# Patient Record
Sex: Male | Born: 1940 | Race: White | Hispanic: No | Marital: Married
Health system: Southern US, Community
[De-identification: ages and names within clinical notes are randomized; demographics above are authoritative.]

---

## 1998-05-11 ENCOUNTER — Other Ambulatory Visit: Admission: RE | Admit: 1998-05-11 | Discharge: 1998-05-11 | Payer: Self-pay | Admitting: Urology

## 1999-11-06 ENCOUNTER — Encounter: Payer: Self-pay | Admitting: Emergency Medicine

## 1999-11-06 ENCOUNTER — Inpatient Hospital Stay (HOSPITAL_COMMUNITY): Admission: EM | Admit: 1999-11-06 | Discharge: 1999-11-07 | Payer: Self-pay | Admitting: Emergency Medicine

## 2000-03-31 ENCOUNTER — Encounter (INDEPENDENT_AMBULATORY_CARE_PROVIDER_SITE_OTHER): Payer: Self-pay

## 2000-03-31 ENCOUNTER — Ambulatory Visit (HOSPITAL_COMMUNITY): Admission: RE | Admit: 2000-03-31 | Discharge: 2000-03-31 | Payer: Self-pay | Admitting: Gastroenterology

## 2001-08-12 ENCOUNTER — Emergency Department (HOSPITAL_COMMUNITY): Admission: EM | Admit: 2001-08-12 | Discharge: 2001-08-12 | Payer: Self-pay | Admitting: Emergency Medicine

## 2001-08-12 ENCOUNTER — Encounter: Payer: Self-pay | Admitting: Emergency Medicine

## 2002-11-08 ENCOUNTER — Ambulatory Visit (HOSPITAL_BASED_OUTPATIENT_CLINIC_OR_DEPARTMENT_OTHER): Admission: RE | Admit: 2002-11-08 | Discharge: 2002-11-08 | Payer: Self-pay | Admitting: Orthopedic Surgery

## 2003-11-01 ENCOUNTER — Ambulatory Visit (HOSPITAL_BASED_OUTPATIENT_CLINIC_OR_DEPARTMENT_OTHER): Admission: RE | Admit: 2003-11-01 | Discharge: 2003-11-01 | Payer: Self-pay | Admitting: General Surgery

## 2005-04-22 ENCOUNTER — Ambulatory Visit (HOSPITAL_COMMUNITY): Admission: RE | Admit: 2005-04-22 | Discharge: 2005-04-22 | Payer: Self-pay | Admitting: Gastroenterology

## 2005-04-22 ENCOUNTER — Encounter (INDEPENDENT_AMBULATORY_CARE_PROVIDER_SITE_OTHER): Payer: Self-pay | Admitting: *Deleted

## 2008-01-21 ENCOUNTER — Ambulatory Visit: Payer: Self-pay | Admitting: Vascular Surgery

## 2011-04-01 ENCOUNTER — Other Ambulatory Visit: Payer: Self-pay | Admitting: Gastroenterology

## 2011-05-13 NOTE — Procedures (Signed)
CAROTID DUPLEX EXAM   INDICATION:  Right eye hemianopsia.  The patient had one episode of the  upper vision in his right eye going away for a short time.  He has had  no further episodes.   HISTORY:  Diabetes:  No.  Cardiac:  No.  Hypertension:  No.  Smoking:  Yes, 1 pack per day for 50 years.  Previous Surgery:  No.  CV History:  No.  Amaurosis Fugax No, Paresthesias No, Hemiparesis No                                       RIGHT             LEFT  Brachial systolic pressure:         160               150  Brachial Doppler waveforms:         Biphasic          Biphasic  Vertebral direction of flow:        Antegrade         Antegrade  DUPLEX VELOCITIES (cm/sec)  CCA peak systolic                   92                87  ECA peak systolic                   129               123  ICA peak systolic                   80                78  ICA end diastolic                   22                17  PLAQUE MORPHOLOGY:                  Mixed             Calcified  PLAQUE AMOUNT:                      Mild              Mild  PLAQUE LOCATION:                    ICA, ECA          ICA, ECA   IMPRESSION:  1. Mild bilateral ECA stenoses.  2. 20-39% bilateral ICA stenoses.  3. A preliminary copy was faxed to Dr. Dierdre Harness office.   ___________________________________________  Di Kindle. Edilia Bo, M.D.   DP/MEDQ  D:  01/21/2008  T:  01/21/2008  Job:  [id number]   cc:   Geoffry Paradise, M.D.

## 2011-05-16 NOTE — Op Note (Signed)
NAMEMILDRED, Logan Mckinney                 ACCOUNT NO.:  [id number]   MEDICAL RECORD NO.:  [id number]          PATIENT TYPE:  AMB   LOCATION:  ENDO                         FACILITY:  [facility]   PHYSICIAN:  John C. Madilyn Fireman, M.D.    DATE OF BIRTH:  1941/03/07   DATE OF PROCEDURE:  04/22/2005  DATE OF DISCHARGE:                                 OPERATIVE REPORT   PROCEDURE:  Colonoscopy.   INDICATIONS FOR PROCEDURE:  History of adenomatous colon polyps in 2001.   DESCRIPTION OF PROCEDURE:  The patient was placed in the left lateral  decubitus position then placed on the pulse monitor with continuous low-flow  oxygen delivered by nasal cannula. He was sedated with 62.5 mcg IV fentanyl  and 7 mg IV Versed. The Olympus video colonoscope was inserted into the  rectum and advanced cecum, confirmed by transillumination at McBurney's  point visualization of ileocecal valve and appendiceal orifice. The prep was  excellent. The cecum, ascending, transverse, descending and sigmoid colon  all appeared normal with no masses, polyps, diverticula or other mucosal  abnormalities. In the rectum, there was seen a 6 mm proximal polyp that was  fulgurated by hot biopsy. In the more distal rectum at approximately 10 cm,  there was a more the diffuse sessile 8 mm polyp that was removed by snare.  The remainder of the rectum appeared normal down to the anus where  retroflexed view revealed no obvious internal hemorrhoids. The scope was  then withdrawn and the patient returned to the recovery room in stable  condition. He tolerated the procedure well and there were no immediate  complications.   IMPRESSION:  Rectal polyps.   PLAN:  Await histology and will repeat colonoscopy in 3-5 years.      JCH/MEDQ  D:  04/22/2005  T:  04/22/2005  Job:  [id number]   cc:   Geoffry Paradise, M.D.  [address]  [place]  [place] [address]  Fax: [phone]

## 2011-05-16 NOTE — Op Note (Signed)
NAME:  Logan Mckinney, Logan Mckinney                           ACCOUNT NO.:  [id number]   MEDICAL RECORD NO.:  [id number]                   PATIENT TYPE:  AMB   LOCATION:  DSC                                  FACILITY:  [facility]   PHYSICIAN:  Sharlet Salina T. Hoxworth, M.D.          DATE OF BIRTH:  [birth year]   DATE OF PROCEDURE:  11/01/2003  DATE OF DISCHARGE:                                 OPERATIVE REPORT   PREOPERATIVE DIAGNOSIS:  Right inguinal hernia.   POSTOPERATIVE DIAGNOSIS:  Right inguinal hernia.   PROCEDURE:  Repair of right inguinal hernia.   SURGEON:  Lorne Skeens. Hoxworth, M.D.   ANESTHESIA:  Local with IV sedation.   BRIEF HISTORY:  The patient is a 70 year old white male with the recent  onset of an uncomfortable bulge in his right groin.  Examination confirms a  reducible right inguinal hernia.  Repair using mesh with an open technique  under local anesthesia with sedation has been recommended and accepted and  the patient is brought to the operating room for this procedure.  The risks  of bleeding, infection and recurrence were discussed and understood  preoperatively.   DESCRIPTION OF PROCEDURE:  The patient was brought to the operating room and  placed in the supine position on the operating table and IV sedation was  administered.  He received preoperative antibiotics.  The right groin was  sterilely prepped and draped.  Local anesthesia was used to block the  ilioinguinal nerve and infiltrate the area of the incision along the right  groin.  An oblique incision was made and dissection was carried down through  subcutaneous tissue.  The external oblique was identified, anesthetized and  divided along the lines of its fibers to the external ring.  The cord was  dissected off the floor of the pubic tubercle and cremasteric fibers divided  bilaterally completely freeing the cord up to the inguinal ring.  There was  a good sized direct defect.  The preperitoneal tissue was  dissected away  from the cord up to the internal ring.  There was a large cord lipoma  associated with the cord structures which was dissected free, clamped and  divided at the internal ring and tied with 3-0 Vicryl.  There was no  peritoneal sac.  The attenuated transversalis fascia was imbricated with  running 2-0 Prolene closing the floor down to a normal contour.  A piece of  Parietex mesh was then used as an onlay widely covering the direct space  with tails that go around the cord at the internal ring.  The mesh was  sutured to the pubic tubercle and then to the iliopubic tract and inguinal  ligament with running 2-0 Prolene.  Immediately the mesh was sutured to the  rectus sheath with interrupted 2-0 Prolene.  The tails were then tacked  together out of the cord with interrupted 2-0 Prolene creating a new  internal  ring as noted with a fingertip.  The cord was returned to its  anatomic position.  The external oblique was closed with this with running 3-  0 Vicryl.  Scarpa's fascia was closed with running 3-0 Vicryl.  The skin was  closed with running subcuticular 4-0 Monocryl and Steri-Strips.  Sponge,  needle and instrument counts were correct.  A dry sterile dressing was  applied and the patient was taken to the recovery room in stable condition.                                               Lorne Skeens. Hoxworth, M.D.    Tory Emerald  D:  11/01/2003  T:  11/01/2003  Job:  [id number]

## 2011-05-16 NOTE — Op Note (Signed)
NAME:  Logan Mckinney, Logan Mckinney                           ACCOUNT NO.:  [id number]   MEDICAL RECORD NO.:  [id number]                   PATIENT TYPE:  AMB   LOCATION:  DSC                                  FACILITY:  [facility]   PHYSICIAN:  Robert A. Thurston Hole, M.D.              DATE OF BIRTH:  1941-11-03   DATE OF PROCEDURE:  11/08/2002  DATE OF DISCHARGE:                                 OPERATIVE REPORT   PREOPERATIVE DIAGNOSIS:  Left elbow synovitis with lateral epicondylitis.   POSTOPERATIVE DIAGNOSIS:  Left elbow synovitis with lateral epicondylitis.   PROCEDURE:  1. Left elbow examination under anesthesia followed by arthroscopic partial     synovectomy.  2. Left elbow arthroscopically assisted lateral epicondylar release.   SURGEON:  Elana Alm. Thurston Hole, M.D.   ANESTHESIA:  General anesthesia.   OPERATIVE TIME:  45 minutes.   COMPLICATIONS:  None.   INDICATIONS FOR PROCEDURE:  The patient is a 70 year old gentleman who has  had significant left elbow pain for the past six to eight months increasing  in nature with signs and symptoms of synovitis and lateral epicondylitis who  has failed conservative care and is now to undergo arthroscopy with lateral  epicondylar release.   DESCRIPTION OF PROCEDURE:  The patient is brought to the operating room on  November 08, 2002.  He was placed under general anesthesia on the stretcher  and then carefully turned prone on the operating table with all pressure  points and genitalia well padded.  His left elbow was examined under  anesthesia.  He had range of motion from 0  to 125 degrees.  The elbow with  stable ligamentous examination.  The left arm was then prepped using sterile  DuraPrep and draped using sterile technique.  He received Ancef 1 g IV  preoperatively for prophylaxis.  Initially through an anterior medial portal  staying just anterior to the medial epicondyle to prevent any injury to the  ulnar nerve, the initial portal was made.  The  arthroscope was placed  through this portal into the joint directly.  Through an anterior lateral  portal, staying just anterior to the lateral epicondyle, a second portal was  made protecting the radial nerve and an arthroscopic probe was placed.  On  initial inspection of the joint, the articular cartilage on the radial head  and capitellum were found to be intact.  The articular cartilage on the  cornoid and on the distal humerus also found to be intact.  Moderate  synovitis in the anterior compartment was debrided.  No loose bodies were  noted.  The lateral epicondylar region was exposed and a second  anterolateral portal was made and debridement and partial release of the  ECRB and ECRL tendon was carried out with the arthroscopic debrider under  direct arthroscopic visualization.  After this was done, no further  pathology was noted anteriorly.  A direct posterior  portal was made and a  posterior lateral portal was made.  The olecranon fossa was entered and  exposed.  Moderate synovitis was debrided.  No loose bodies were noted.  The  olecranon itself was found to be intact and normal in appearance.  The  posterior lateral and posterior medial gutters were carefully inspected.  This was found to be free of pathology.  After this was done, it was felt  that all pathology had been satisfactorily addressed.  The instruments were  removed.  The portals were closed with 4-0 nylon suture and injected with  0.25% Marcaine with epinephrine. The lateral epicondylar region also  injected with Depo-Medrol 40 mg and Marcaine as well.  After this was done,  sterile dressings were applied.  The patient then turned supine, awakened,  extubated and taken to the recovery room in a stable condition.  Needle and  sponge counts correct x2 at the end of the case.   FOLLOW UP CARE:  The patient will be followed as an outpatient on Vicodin,  Naprosyn and Keflex.  I will see him back in the office in a week  for  sutures out and follow-up.                                                Robert A. Thurston Hole, M.D.    RAW/MEDQ  D:  11/08/2002  T:  11/08/2002  Job:  [id number]

## 2012-03-12 DIAGNOSIS — Z125 Encounter for screening for malignant neoplasm of prostate: Secondary | ICD-10-CM | POA: Diagnosis not present

## 2012-03-12 DIAGNOSIS — E785 Hyperlipidemia, unspecified: Secondary | ICD-10-CM | POA: Diagnosis not present

## 2012-03-12 DIAGNOSIS — R7301 Impaired fasting glucose: Secondary | ICD-10-CM | POA: Diagnosis not present

## 2012-03-18 DIAGNOSIS — Z0000 Encounter for general adult medical examination without abnormal findings: Secondary | ICD-10-CM | POA: Diagnosis not present

## 2012-03-18 DIAGNOSIS — R7301 Impaired fasting glucose: Secondary | ICD-10-CM | POA: Diagnosis not present

## 2012-03-18 DIAGNOSIS — Z125 Encounter for screening for malignant neoplasm of prostate: Secondary | ICD-10-CM | POA: Diagnosis not present

## 2012-03-18 DIAGNOSIS — F17200 Nicotine dependence, unspecified, uncomplicated: Secondary | ICD-10-CM | POA: Diagnosis not present

## 2012-03-18 DIAGNOSIS — E785 Hyperlipidemia, unspecified: Secondary | ICD-10-CM | POA: Diagnosis not present

## 2012-03-19 DIAGNOSIS — Z1212 Encounter for screening for malignant neoplasm of rectum: Secondary | ICD-10-CM | POA: Diagnosis not present

## 2012-04-28 ENCOUNTER — Other Ambulatory Visit: Payer: Self-pay | Admitting: Dermatology

## 2012-04-28 DIAGNOSIS — C44721 Squamous cell carcinoma of skin of unspecified lower limb, including hip: Secondary | ICD-10-CM | POA: Diagnosis not present

## 2012-04-28 DIAGNOSIS — L723 Sebaceous cyst: Secondary | ICD-10-CM | POA: Diagnosis not present

## 2012-09-29 DIAGNOSIS — E785 Hyperlipidemia, unspecified: Secondary | ICD-10-CM | POA: Diagnosis not present

## 2012-09-29 DIAGNOSIS — Z23 Encounter for immunization: Secondary | ICD-10-CM | POA: Diagnosis not present

## 2012-09-29 DIAGNOSIS — M1990 Unspecified osteoarthritis, unspecified site: Secondary | ICD-10-CM | POA: Diagnosis not present

## 2012-09-29 DIAGNOSIS — R7301 Impaired fasting glucose: Secondary | ICD-10-CM | POA: Diagnosis not present

## 2012-09-29 DIAGNOSIS — F17200 Nicotine dependence, unspecified, uncomplicated: Secondary | ICD-10-CM | POA: Diagnosis not present

## 2012-10-04 DIAGNOSIS — L723 Sebaceous cyst: Secondary | ICD-10-CM | POA: Diagnosis not present

## 2012-10-04 DIAGNOSIS — L570 Actinic keratosis: Secondary | ICD-10-CM | POA: Diagnosis not present

## 2012-12-24 DIAGNOSIS — J309 Allergic rhinitis, unspecified: Secondary | ICD-10-CM | POA: Diagnosis not present

## 2012-12-24 DIAGNOSIS — J019 Acute sinusitis, unspecified: Secondary | ICD-10-CM | POA: Diagnosis not present

## 2013-03-10 DIAGNOSIS — H5200 Hypermetropia, unspecified eye: Secondary | ICD-10-CM | POA: Diagnosis not present

## 2013-03-10 DIAGNOSIS — H2510 Age-related nuclear cataract, unspecified eye: Secondary | ICD-10-CM | POA: Diagnosis not present

## 2013-03-10 DIAGNOSIS — H524 Presbyopia: Secondary | ICD-10-CM | POA: Diagnosis not present

## 2013-03-30 DIAGNOSIS — Z125 Encounter for screening for malignant neoplasm of prostate: Secondary | ICD-10-CM | POA: Diagnosis not present

## 2013-03-30 DIAGNOSIS — E785 Hyperlipidemia, unspecified: Secondary | ICD-10-CM | POA: Diagnosis not present

## 2013-03-30 DIAGNOSIS — Z79899 Other long term (current) drug therapy: Secondary | ICD-10-CM | POA: Diagnosis not present

## 2013-03-30 DIAGNOSIS — R7301 Impaired fasting glucose: Secondary | ICD-10-CM | POA: Diagnosis not present

## 2013-04-06 DIAGNOSIS — R7301 Impaired fasting glucose: Secondary | ICD-10-CM | POA: Diagnosis not present

## 2013-04-06 DIAGNOSIS — K219 Gastro-esophageal reflux disease without esophagitis: Secondary | ICD-10-CM | POA: Diagnosis not present

## 2013-04-06 DIAGNOSIS — Z0000 Encounter for general adult medical examination without abnormal findings: Secondary | ICD-10-CM | POA: Diagnosis not present

## 2013-04-06 DIAGNOSIS — N401 Enlarged prostate with lower urinary tract symptoms: Secondary | ICD-10-CM | POA: Diagnosis not present

## 2013-04-06 DIAGNOSIS — F17200 Nicotine dependence, unspecified, uncomplicated: Secondary | ICD-10-CM | POA: Diagnosis not present

## 2013-04-06 DIAGNOSIS — M1990 Unspecified osteoarthritis, unspecified site: Secondary | ICD-10-CM | POA: Diagnosis not present

## 2013-04-06 DIAGNOSIS — Z79899 Other long term (current) drug therapy: Secondary | ICD-10-CM | POA: Diagnosis not present

## 2013-04-06 DIAGNOSIS — Z1331 Encounter for screening for depression: Secondary | ICD-10-CM | POA: Diagnosis not present

## 2013-04-06 DIAGNOSIS — E785 Hyperlipidemia, unspecified: Secondary | ICD-10-CM | POA: Diagnosis not present

## 2013-10-20 DIAGNOSIS — Z23 Encounter for immunization: Secondary | ICD-10-CM | POA: Diagnosis not present

## 2013-11-15 ENCOUNTER — Other Ambulatory Visit: Payer: Self-pay | Admitting: Dermatology

## 2013-11-15 DIAGNOSIS — C44319 Basal cell carcinoma of skin of other parts of face: Secondary | ICD-10-CM | POA: Diagnosis not present

## 2013-11-15 DIAGNOSIS — L570 Actinic keratosis: Secondary | ICD-10-CM | POA: Diagnosis not present

## 2013-11-16 DIAGNOSIS — L570 Actinic keratosis: Secondary | ICD-10-CM | POA: Diagnosis not present

## 2013-11-16 DIAGNOSIS — C44319 Basal cell carcinoma of skin of other parts of face: Secondary | ICD-10-CM | POA: Diagnosis not present

## 2013-11-16 DIAGNOSIS — D485 Neoplasm of uncertain behavior of skin: Secondary | ICD-10-CM | POA: Diagnosis not present

## 2014-01-12 DIAGNOSIS — C44319 Basal cell carcinoma of skin of other parts of face: Secondary | ICD-10-CM | POA: Diagnosis not present

## 2014-01-16 DIAGNOSIS — C44319 Basal cell carcinoma of skin of other parts of face: Secondary | ICD-10-CM | POA: Diagnosis not present

## 2014-01-18 DIAGNOSIS — C44319 Basal cell carcinoma of skin of other parts of face: Secondary | ICD-10-CM | POA: Diagnosis not present

## 2014-01-20 DIAGNOSIS — C44319 Basal cell carcinoma of skin of other parts of face: Secondary | ICD-10-CM | POA: Diagnosis not present

## 2014-01-23 DIAGNOSIS — C44319 Basal cell carcinoma of skin of other parts of face: Secondary | ICD-10-CM | POA: Diagnosis not present

## 2014-01-25 DIAGNOSIS — C44319 Basal cell carcinoma of skin of other parts of face: Secondary | ICD-10-CM | POA: Diagnosis not present

## 2014-01-27 DIAGNOSIS — C44319 Basal cell carcinoma of skin of other parts of face: Secondary | ICD-10-CM | POA: Diagnosis not present

## 2014-01-30 DIAGNOSIS — C44319 Basal cell carcinoma of skin of other parts of face: Secondary | ICD-10-CM | POA: Diagnosis not present

## 2014-02-01 DIAGNOSIS — C44319 Basal cell carcinoma of skin of other parts of face: Secondary | ICD-10-CM | POA: Diagnosis not present

## 2014-02-03 DIAGNOSIS — C44319 Basal cell carcinoma of skin of other parts of face: Secondary | ICD-10-CM | POA: Diagnosis not present

## 2014-02-06 DIAGNOSIS — C44319 Basal cell carcinoma of skin of other parts of face: Secondary | ICD-10-CM | POA: Diagnosis not present

## 2014-02-08 DIAGNOSIS — C44319 Basal cell carcinoma of skin of other parts of face: Secondary | ICD-10-CM | POA: Diagnosis not present

## 2014-02-10 DIAGNOSIS — C44319 Basal cell carcinoma of skin of other parts of face: Secondary | ICD-10-CM | POA: Diagnosis not present

## 2014-04-04 DIAGNOSIS — Z125 Encounter for screening for malignant neoplasm of prostate: Secondary | ICD-10-CM | POA: Diagnosis not present

## 2014-04-04 DIAGNOSIS — R7301 Impaired fasting glucose: Secondary | ICD-10-CM | POA: Diagnosis not present

## 2014-04-04 DIAGNOSIS — E785 Hyperlipidemia, unspecified: Secondary | ICD-10-CM | POA: Diagnosis not present

## 2014-04-10 DIAGNOSIS — M1990 Unspecified osteoarthritis, unspecified site: Secondary | ICD-10-CM | POA: Diagnosis not present

## 2014-04-10 DIAGNOSIS — Z6827 Body mass index (BMI) 27.0-27.9, adult: Secondary | ICD-10-CM | POA: Diagnosis not present

## 2014-04-10 DIAGNOSIS — Z125 Encounter for screening for malignant neoplasm of prostate: Secondary | ICD-10-CM | POA: Diagnosis not present

## 2014-04-10 DIAGNOSIS — R7301 Impaired fasting glucose: Secondary | ICD-10-CM | POA: Diagnosis not present

## 2014-04-10 DIAGNOSIS — F17200 Nicotine dependence, unspecified, uncomplicated: Secondary | ICD-10-CM | POA: Diagnosis not present

## 2014-04-10 DIAGNOSIS — K219 Gastro-esophageal reflux disease without esophagitis: Secondary | ICD-10-CM | POA: Diagnosis not present

## 2014-04-10 DIAGNOSIS — Z1331 Encounter for screening for depression: Secondary | ICD-10-CM | POA: Diagnosis not present

## 2014-04-10 DIAGNOSIS — Z0000 Encounter for general adult medical examination without abnormal findings: Secondary | ICD-10-CM | POA: Diagnosis not present

## 2014-04-11 DIAGNOSIS — Z1212 Encounter for screening for malignant neoplasm of rectum: Secondary | ICD-10-CM | POA: Diagnosis not present

## 2014-04-26 ENCOUNTER — Other Ambulatory Visit: Payer: Self-pay | Admitting: Physician Assistant

## 2014-04-26 DIAGNOSIS — L570 Actinic keratosis: Secondary | ICD-10-CM | POA: Diagnosis not present

## 2014-04-26 DIAGNOSIS — L280 Lichen simplex chronicus: Secondary | ICD-10-CM | POA: Diagnosis not present

## 2014-04-26 DIAGNOSIS — D485 Neoplasm of uncertain behavior of skin: Secondary | ICD-10-CM | POA: Diagnosis not present

## 2014-04-26 DIAGNOSIS — L821 Other seborrheic keratosis: Secondary | ICD-10-CM | POA: Diagnosis not present

## 2014-06-01 DIAGNOSIS — H60399 Other infective otitis externa, unspecified ear: Secondary | ICD-10-CM | POA: Diagnosis not present

## 2014-06-03 DIAGNOSIS — H60399 Other infective otitis externa, unspecified ear: Secondary | ICD-10-CM | POA: Diagnosis not present

## 2014-06-15 DIAGNOSIS — H2510 Age-related nuclear cataract, unspecified eye: Secondary | ICD-10-CM | POA: Diagnosis not present

## 2014-10-11 DIAGNOSIS — L570 Actinic keratosis: Secondary | ICD-10-CM | POA: Diagnosis not present

## 2014-10-11 DIAGNOSIS — D239 Other benign neoplasm of skin, unspecified: Secondary | ICD-10-CM | POA: Diagnosis not present

## 2014-10-24 DIAGNOSIS — Z23 Encounter for immunization: Secondary | ICD-10-CM | POA: Diagnosis not present

## 2014-11-28 DIAGNOSIS — Z6827 Body mass index (BMI) 27.0-27.9, adult: Secondary | ICD-10-CM | POA: Diagnosis not present

## 2014-11-28 DIAGNOSIS — S39012A Strain of muscle, fascia and tendon of lower back, initial encounter: Secondary | ICD-10-CM | POA: Diagnosis not present

## 2014-12-06 DIAGNOSIS — M5137 Other intervertebral disc degeneration, lumbosacral region: Secondary | ICD-10-CM | POA: Diagnosis not present

## 2014-12-06 DIAGNOSIS — M47817 Spondylosis without myelopathy or radiculopathy, lumbosacral region: Secondary | ICD-10-CM | POA: Diagnosis not present

## 2014-12-06 DIAGNOSIS — Z6827 Body mass index (BMI) 27.0-27.9, adult: Secondary | ICD-10-CM | POA: Diagnosis not present

## 2014-12-06 DIAGNOSIS — S39012A Strain of muscle, fascia and tendon of lower back, initial encounter: Secondary | ICD-10-CM | POA: Diagnosis not present

## 2014-12-06 DIAGNOSIS — M169 Osteoarthritis of hip, unspecified: Secondary | ICD-10-CM | POA: Diagnosis not present

## 2015-02-08 DIAGNOSIS — K5900 Constipation, unspecified: Secondary | ICD-10-CM | POA: Diagnosis not present

## 2015-02-08 DIAGNOSIS — R7302 Impaired glucose tolerance (oral): Secondary | ICD-10-CM | POA: Diagnosis not present

## 2015-02-08 DIAGNOSIS — Z125 Encounter for screening for malignant neoplasm of prostate: Secondary | ICD-10-CM | POA: Diagnosis not present

## 2015-02-08 DIAGNOSIS — M1990 Unspecified osteoarthritis, unspecified site: Secondary | ICD-10-CM | POA: Diagnosis not present

## 2015-02-08 DIAGNOSIS — Z720 Tobacco use: Secondary | ICD-10-CM | POA: Diagnosis not present

## 2015-02-08 DIAGNOSIS — K219 Gastro-esophageal reflux disease without esophagitis: Secondary | ICD-10-CM | POA: Diagnosis not present

## 2015-02-08 DIAGNOSIS — E785 Hyperlipidemia, unspecified: Secondary | ICD-10-CM | POA: Diagnosis not present

## 2015-02-08 DIAGNOSIS — N401 Enlarged prostate with lower urinary tract symptoms: Secondary | ICD-10-CM | POA: Diagnosis not present

## 2015-02-08 DIAGNOSIS — Z6826 Body mass index (BMI) 26.0-26.9, adult: Secondary | ICD-10-CM | POA: Diagnosis not present

## 2015-02-09 DIAGNOSIS — K5900 Constipation, unspecified: Secondary | ICD-10-CM | POA: Diagnosis not present

## 2015-02-21 DIAGNOSIS — L570 Actinic keratosis: Secondary | ICD-10-CM | POA: Diagnosis not present

## 2015-02-21 DIAGNOSIS — D239 Other benign neoplasm of skin, unspecified: Secondary | ICD-10-CM | POA: Diagnosis not present

## 2015-04-09 DIAGNOSIS — Z79899 Other long term (current) drug therapy: Secondary | ICD-10-CM | POA: Diagnosis not present

## 2015-04-09 DIAGNOSIS — R7302 Impaired glucose tolerance (oral): Secondary | ICD-10-CM | POA: Diagnosis not present

## 2015-04-09 DIAGNOSIS — Z125 Encounter for screening for malignant neoplasm of prostate: Secondary | ICD-10-CM | POA: Diagnosis not present

## 2015-04-09 DIAGNOSIS — E785 Hyperlipidemia, unspecified: Secondary | ICD-10-CM | POA: Diagnosis not present

## 2015-04-16 ENCOUNTER — Other Ambulatory Visit: Payer: Self-pay | Admitting: Internal Medicine

## 2015-04-16 DIAGNOSIS — D692 Other nonthrombocytopenic purpura: Secondary | ICD-10-CM | POA: Diagnosis not present

## 2015-04-16 DIAGNOSIS — Z720 Tobacco use: Secondary | ICD-10-CM | POA: Diagnosis not present

## 2015-04-16 DIAGNOSIS — Z139 Encounter for screening, unspecified: Secondary | ICD-10-CM

## 2015-04-16 DIAGNOSIS — R0989 Other specified symptoms and signs involving the circulatory and respiratory systems: Secondary | ICD-10-CM | POA: Diagnosis not present

## 2015-04-16 DIAGNOSIS — E785 Hyperlipidemia, unspecified: Secondary | ICD-10-CM | POA: Diagnosis not present

## 2015-04-16 DIAGNOSIS — M5136 Other intervertebral disc degeneration, lumbar region: Secondary | ICD-10-CM

## 2015-04-16 DIAGNOSIS — Z0000 Encounter for general adult medical examination without abnormal findings: Secondary | ICD-10-CM | POA: Diagnosis not present

## 2015-04-16 DIAGNOSIS — Z23 Encounter for immunization: Secondary | ICD-10-CM | POA: Diagnosis not present

## 2015-04-16 DIAGNOSIS — J449 Chronic obstructive pulmonary disease, unspecified: Secondary | ICD-10-CM | POA: Diagnosis not present

## 2015-04-16 DIAGNOSIS — Z6826 Body mass index (BMI) 26.0-26.9, adult: Secondary | ICD-10-CM | POA: Diagnosis not present

## 2015-04-16 DIAGNOSIS — M5380 Other specified dorsopathies, site unspecified: Secondary | ICD-10-CM | POA: Diagnosis not present

## 2015-04-16 DIAGNOSIS — E119 Type 2 diabetes mellitus without complications: Secondary | ICD-10-CM | POA: Diagnosis not present

## 2015-04-25 DIAGNOSIS — Z1212 Encounter for screening for malignant neoplasm of rectum: Secondary | ICD-10-CM | POA: Diagnosis not present

## 2015-05-03 ENCOUNTER — Ambulatory Visit
Admission: RE | Admit: 2015-05-03 | Discharge: 2015-05-03 | Disposition: A | Discharge status: Home / Self Care | Payer: Medicare Other | Source: Ambulatory Visit | Attending: Internal Medicine | Admitting: Internal Medicine

## 2015-05-03 DIAGNOSIS — Z122 Encounter for screening for malignant neoplasm of respiratory organs: Secondary | ICD-10-CM | POA: Diagnosis not present

## 2015-05-03 DIAGNOSIS — M47816 Spondylosis without myelopathy or radiculopathy, lumbar region: Secondary | ICD-10-CM | POA: Diagnosis not present

## 2015-05-03 DIAGNOSIS — I2510 Atherosclerotic heart disease of native coronary artery without angina pectoris: Secondary | ICD-10-CM | POA: Diagnosis not present

## 2015-05-03 DIAGNOSIS — J432 Centrilobular emphysema: Secondary | ICD-10-CM | POA: Diagnosis not present

## 2015-05-03 DIAGNOSIS — M5126 Other intervertebral disc displacement, lumbar region: Secondary | ICD-10-CM | POA: Diagnosis not present

## 2015-05-03 DIAGNOSIS — F17210 Nicotine dependence, cigarettes, uncomplicated: Secondary | ICD-10-CM | POA: Diagnosis not present

## 2015-05-03 DIAGNOSIS — M5136 Other intervertebral disc degeneration, lumbar region: Secondary | ICD-10-CM

## 2015-05-03 DIAGNOSIS — Z139 Encounter for screening, unspecified: Secondary | ICD-10-CM

## 2015-05-03 DIAGNOSIS — M9973 Connective tissue and disc stenosis of intervertebral foramina of lumbar region: Secondary | ICD-10-CM | POA: Diagnosis not present

## 2015-05-03 DIAGNOSIS — Z85820 Personal history of malignant melanoma of skin: Secondary | ICD-10-CM | POA: Diagnosis not present

## 2015-05-07 ENCOUNTER — Other Ambulatory Visit: Payer: Self-pay | Admitting: Internal Medicine

## 2015-05-07 DIAGNOSIS — M5136 Other intervertebral disc degeneration, lumbar region: Secondary | ICD-10-CM

## 2015-05-10 ENCOUNTER — Ambulatory Visit
Admission: RE | Admit: 2015-05-10 | Discharge: 2015-05-10 | Disposition: A | Discharge status: Home / Self Care | Payer: Medicare Other | Source: Ambulatory Visit | Attending: Internal Medicine | Admitting: Internal Medicine

## 2015-05-10 DIAGNOSIS — R0989 Other specified symptoms and signs involving the circulatory and respiratory systems: Secondary | ICD-10-CM | POA: Diagnosis not present

## 2015-05-10 DIAGNOSIS — M5136 Other intervertebral disc degeneration, lumbar region: Secondary | ICD-10-CM

## 2015-05-10 DIAGNOSIS — M4806 Spinal stenosis, lumbar region: Secondary | ICD-10-CM | POA: Diagnosis not present

## 2015-05-10 DIAGNOSIS — N289 Disorder of kidney and ureter, unspecified: Secondary | ICD-10-CM | POA: Diagnosis not present

## 2015-05-10 MED ORDER — IOHEXOL 180 MG/ML  SOLN
1.0000 mL | Freq: Once | INTRAMUSCULAR | Status: AC | PRN
  Administered 2015-05-10: 1 mL via EPIDURAL

## 2015-05-10 MED ORDER — METHYLPREDNISOLONE ACETATE 40 MG/ML INJ SUSP (RADIOLOG
120.0000 mg | Freq: Once | INTRAMUSCULAR | Status: AC
  Administered 2015-05-10: 120 mg via EPIDURAL

## 2015-05-10 NOTE — Discharge Instructions (Signed)

## 2015-07-11 DIAGNOSIS — H2513 Age-related nuclear cataract, bilateral: Secondary | ICD-10-CM | POA: Diagnosis not present

## 2015-07-11 DIAGNOSIS — H01115 Allergic dermatitis of left lower eyelid: Secondary | ICD-10-CM | POA: Diagnosis not present

## 2015-08-07 ENCOUNTER — Other Ambulatory Visit: Payer: Self-pay | Admitting: Gastroenterology

## 2015-08-07 DIAGNOSIS — Z86010 Personal history of colonic polyps: Secondary | ICD-10-CM | POA: Diagnosis not present

## 2015-08-07 DIAGNOSIS — D123 Benign neoplasm of transverse colon: Secondary | ICD-10-CM | POA: Diagnosis not present

## 2015-08-07 DIAGNOSIS — D126 Benign neoplasm of colon, unspecified: Secondary | ICD-10-CM | POA: Diagnosis not present

## 2015-08-07 DIAGNOSIS — Z09 Encounter for follow-up examination after completed treatment for conditions other than malignant neoplasm: Secondary | ICD-10-CM | POA: Diagnosis not present

## 2015-08-21 DIAGNOSIS — L570 Actinic keratosis: Secondary | ICD-10-CM | POA: Diagnosis not present

## 2015-08-21 DIAGNOSIS — L820 Inflamed seborrheic keratosis: Secondary | ICD-10-CM | POA: Diagnosis not present

## 2015-08-21 DIAGNOSIS — D485 Neoplasm of uncertain behavior of skin: Secondary | ICD-10-CM | POA: Diagnosis not present

## 2015-08-21 DIAGNOSIS — L814 Other melanin hyperpigmentation: Secondary | ICD-10-CM | POA: Diagnosis not present

## 2015-09-24 ENCOUNTER — Other Ambulatory Visit: Payer: Self-pay | Admitting: Internal Medicine

## 2015-09-24 DIAGNOSIS — M5136 Other intervertebral disc degeneration, lumbar region: Secondary | ICD-10-CM

## 2015-09-26 ENCOUNTER — Ambulatory Visit
Admission: RE | Admit: 2015-09-26 | Discharge: 2015-09-26 | Disposition: A | Discharge status: Home / Self Care | Payer: Medicare Other | Source: Ambulatory Visit | Attending: Internal Medicine | Admitting: Internal Medicine

## 2015-09-26 VITALS — BP 188/78 | HR 81

## 2015-09-26 DIAGNOSIS — M545 Low back pain: Secondary | ICD-10-CM | POA: Diagnosis not present

## 2015-09-26 DIAGNOSIS — M5137 Other intervertebral disc degeneration, lumbosacral region: Secondary | ICD-10-CM

## 2015-09-26 DIAGNOSIS — M5136 Other intervertebral disc degeneration, lumbar region: Secondary | ICD-10-CM

## 2015-09-26 DIAGNOSIS — M47817 Spondylosis without myelopathy or radiculopathy, lumbosacral region: Secondary | ICD-10-CM | POA: Diagnosis not present

## 2015-09-26 MED ORDER — IOHEXOL 180 MG/ML  SOLN
1.0000 mL | Freq: Once | INTRAMUSCULAR | Status: DC | PRN
  Administered 2015-09-26: 1 mL via EPIDURAL

## 2015-09-26 MED ORDER — METHYLPREDNISOLONE ACETATE 40 MG/ML INJ SUSP (RADIOLOG
120.0000 mg | Freq: Once | INTRAMUSCULAR | Status: AC
  Administered 2015-09-26: 120 mg via EPIDURAL

## 2015-11-06 DIAGNOSIS — Z23 Encounter for immunization: Secondary | ICD-10-CM | POA: Diagnosis not present

## 2015-11-29 DIAGNOSIS — J441 Chronic obstructive pulmonary disease with (acute) exacerbation: Secondary | ICD-10-CM | POA: Diagnosis not present

## 2015-11-29 DIAGNOSIS — J019 Acute sinusitis, unspecified: Secondary | ICD-10-CM | POA: Diagnosis not present

## 2015-11-29 DIAGNOSIS — Z720 Tobacco use: Secondary | ICD-10-CM | POA: Diagnosis not present

## 2015-11-29 DIAGNOSIS — Z6826 Body mass index (BMI) 26.0-26.9, adult: Secondary | ICD-10-CM | POA: Diagnosis not present

## 2016-02-09 IMAGING — MR MR LUMBAR SPINE W/O CM
5 series · 43 of 48 positions shown · non-contrast
Comparison: None.

CLINICAL DATA: Low back pain extending into the right hip for 8
months. Remote history of melanoma along the right side of the
abdomen. Degenerative disc disease.

EXAM:
MRI LUMBAR SPINE WITHOUT CONTRAST
TECHNIQUE: Multiplanar, multisequence MR imaging of the lumbar spine was
performed. No intravenous contrast was administered.

[Series 3: tirm sag · sagittal · 4.0mm · 0.59mm/px · 6 of 15 slices shown]
[im 1/15]
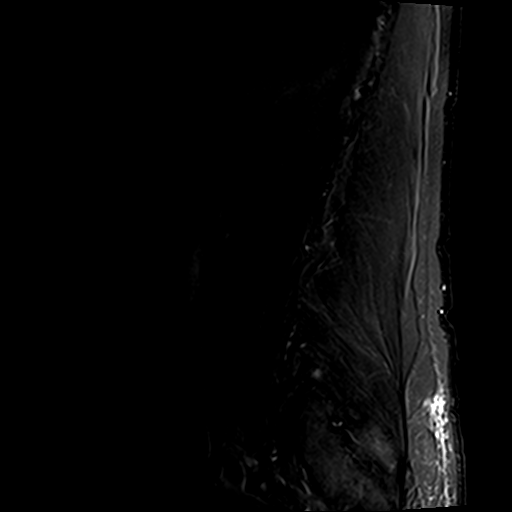
[im 3/15]
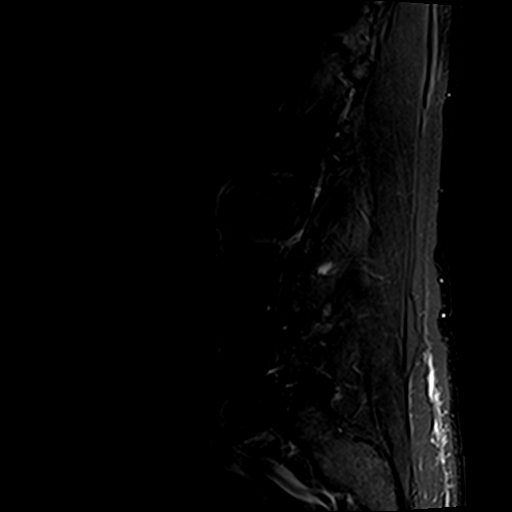
[im 6/15]
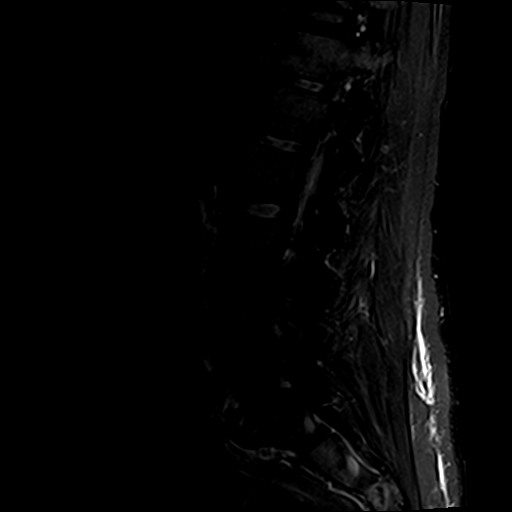
[im 9/15]
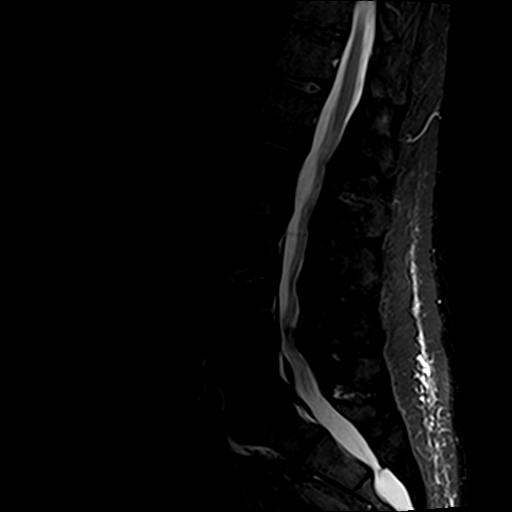
[im 12/15]
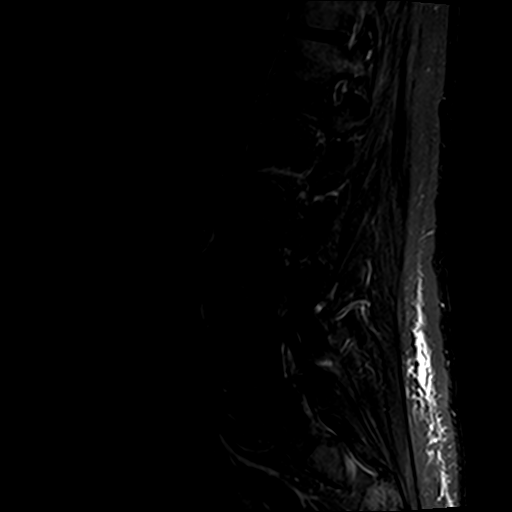
[im 15/15]
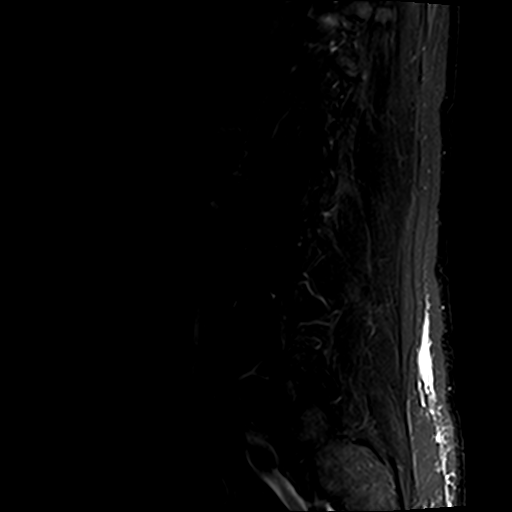

[Series 4: T2 · sagittal · 4.0mm · 0.94mm/px · 6 of 15 slices shown (1 of 2)]
[im 1/15]
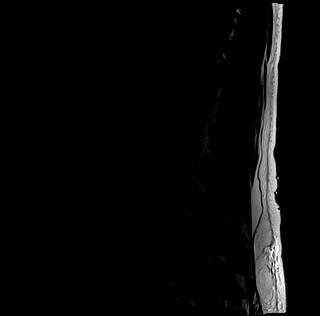
[im 3/15]
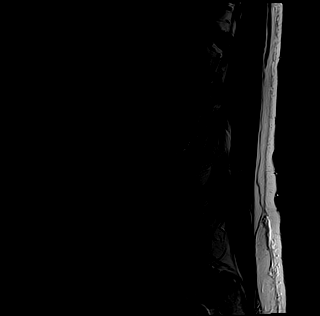
[im 6/15]
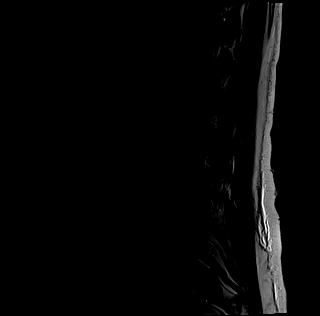
[im 9/15]
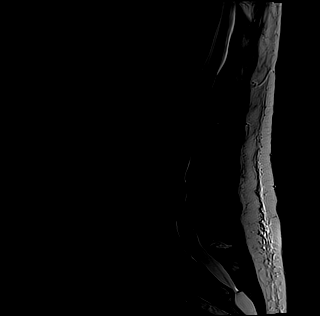
[im 12/15]
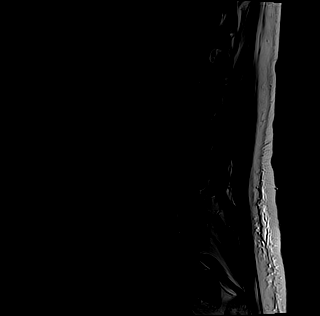
[im 15/15]
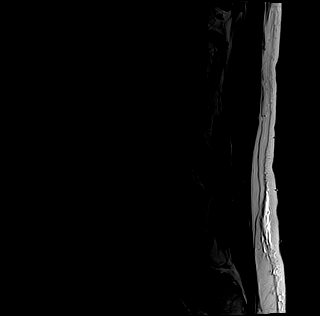

[Series 5: T1 · sagittal · 4.0mm · 0.94mm/px · 6 of 15 slices shown (1 of 2)]
[im 1/15]
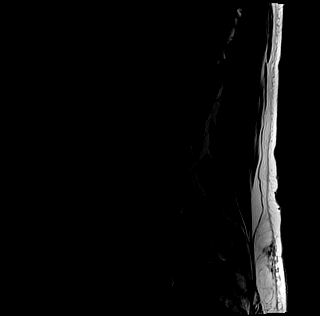
[im 3/15]
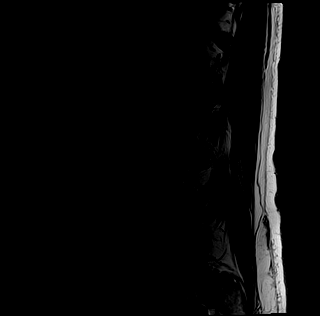
[im 6/15]
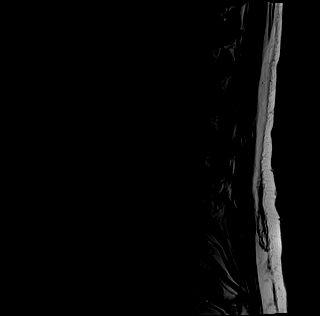
[im 9/15]
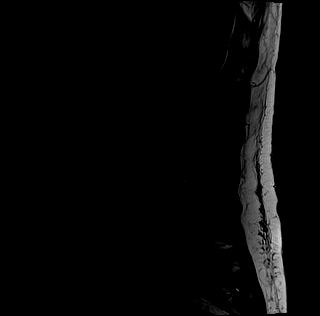
[im 12/15]
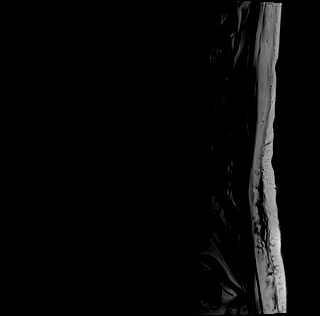
[im 15/15]
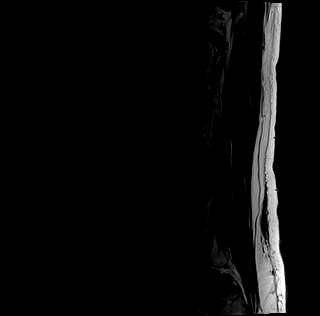

[Series 6: T1 · axial · 4.0mm · 0.70mm/px · z∈[-120,+81]mm · 10 of 35 slices shown (2 of 2)]
[im 1/35]
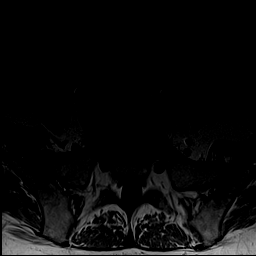
[im 3/35]
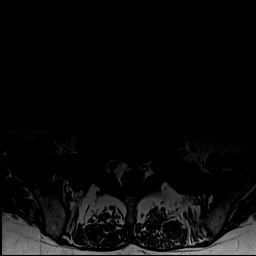
[im 5/35]
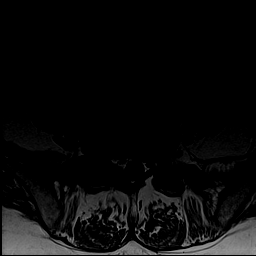
[im 10/35]
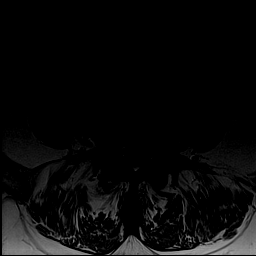
[im 15/35]
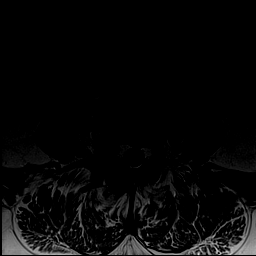
[im 18/35]
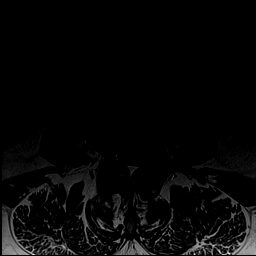
[im 20/35]
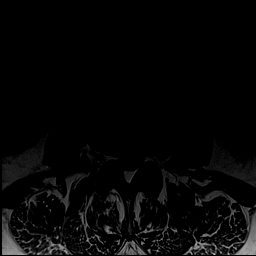
[im 25/35]
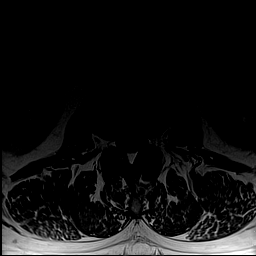
[im 30/35]
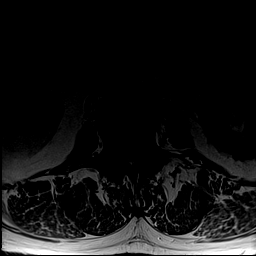
[im 35/35]
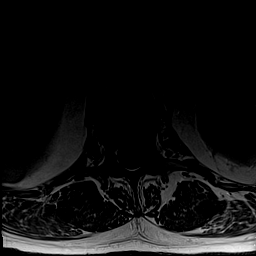

[Series 7: T2 · axial · 4.0mm · 0.70mm/px · z∈[-120,+81]mm · 15 of 35 slices shown (2 of 2)]
[im 1/35]
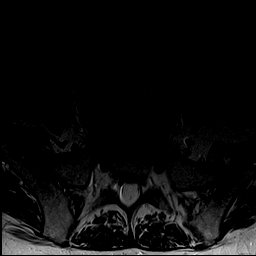
[im 3/35]
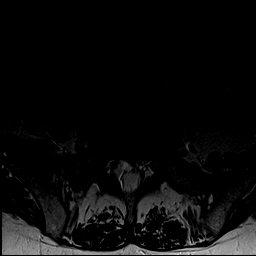
[im 5/35]
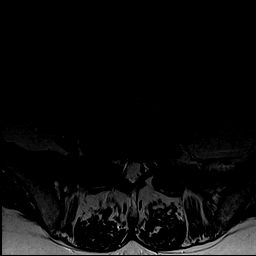
[im 8/35]
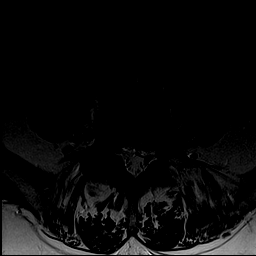
[im 10/35]
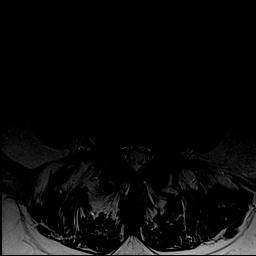
[im 13/35]
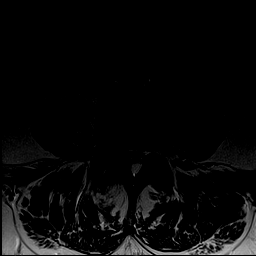
[im 15/35]
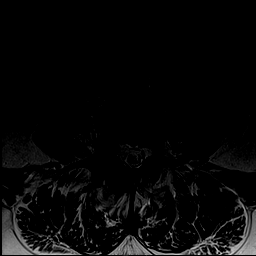
[im 18/35]
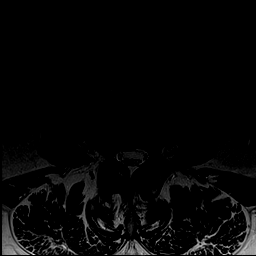
[im 20/35]
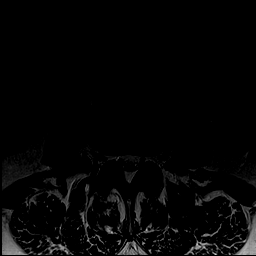
[im 22/35]
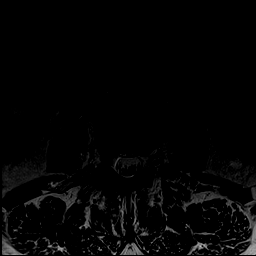
[im 25/35]
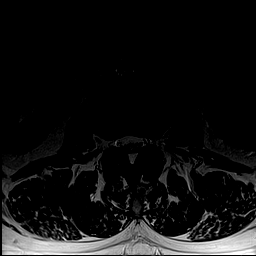
[im 27/35]
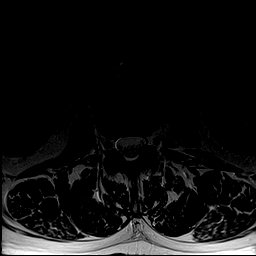
[im 30/35]
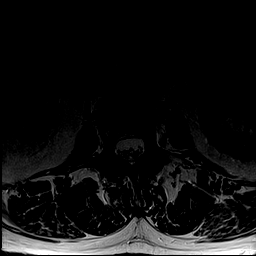
[im 32/35]
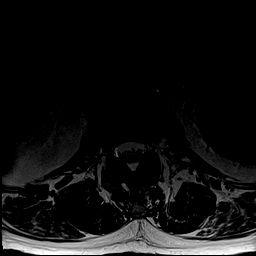
[im 35/35]
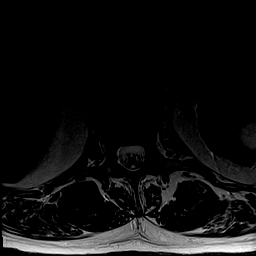

[43 of 48 positions shown; findings below may reference images not displayed]

FINDINGS: Normal signal is present in the conus medullaris which terminates at
L1, within normal limits. Chronic endplate marrow changes are
evident on the right at L5-S1. Leftward curvature of the lower
lumbar spine is centered at L4-5. There is rightward curvature at
L2. AP alignment is anatomic.

A 2.4 cm cyst in the left kidney is incompletely imaged.
Atherosclerotic changes are noted in the aorta. Aneurysmal dilation
of the right iliac artery measures up to 2.2 cm. There is no
aneurysmal dilation of the visualized aorta.

L1-2: Mild facet hypertrophy is present bilaterally. There is no
significant stenosis.

L2-3: A mild broad-based disc protrusion is present. Moderate facet
hypertrophy is noted. There is no significant stenosis.

L3-4: A broad-based disc protrusion is present. Moderate facet
hypertrophy is evident. There slight distortion of the central canal
and foramina without significant stenosis.

L4-5: A broad-based disc protrusion is present. Moderate facet
hypertrophy is worse on the right. This results in moderate right
subarticular and foraminal narrowing. Mild left foraminal stenosis
is present.

L5-S1: Chronic loss of disc height is noted. Asymmetric right-sided
endplate spurring results an mild right foraminal stenosis.
Asymmetric facet hypertrophy contributes.
IMPRESSION: 1. The most significant disease is at L4-5 where moderate right
subarticular and foraminal stenosis is secondary to moderate facet
hypertrophy and a broad-based rightward disc protrusion.
2. Mild left foraminal narrowing at L4-5.
3. Mild right foraminal narrowing at L5-S1 secondary to endplate
spurring and facet hypertrophy.
4. Broad-based disc protrusion and moderate facet hypertrophy at
L2-3 and L3-4 without significant stenosis.

## 2016-04-14 DIAGNOSIS — E1151 Type 2 diabetes mellitus with diabetic peripheral angiopathy without gangrene: Secondary | ICD-10-CM | POA: Diagnosis not present

## 2016-04-14 DIAGNOSIS — Z125 Encounter for screening for malignant neoplasm of prostate: Secondary | ICD-10-CM | POA: Diagnosis not present

## 2016-04-14 DIAGNOSIS — E784 Other hyperlipidemia: Secondary | ICD-10-CM | POA: Diagnosis not present

## 2016-04-18 DIAGNOSIS — Z125 Encounter for screening for malignant neoplasm of prostate: Secondary | ICD-10-CM | POA: Diagnosis not present

## 2016-04-18 DIAGNOSIS — N401 Enlarged prostate with lower urinary tract symptoms: Secondary | ICD-10-CM | POA: Diagnosis not present

## 2016-04-18 DIAGNOSIS — Z1389 Encounter for screening for other disorder: Secondary | ICD-10-CM | POA: Diagnosis not present

## 2016-04-18 DIAGNOSIS — E1151 Type 2 diabetes mellitus with diabetic peripheral angiopathy without gangrene: Secondary | ICD-10-CM | POA: Diagnosis not present

## 2016-04-18 DIAGNOSIS — M5136 Other intervertebral disc degeneration, lumbar region: Secondary | ICD-10-CM | POA: Diagnosis not present

## 2016-04-18 DIAGNOSIS — Z720 Tobacco use: Secondary | ICD-10-CM | POA: Diagnosis not present

## 2016-04-18 DIAGNOSIS — I700 Atherosclerosis of aorta: Secondary | ICD-10-CM | POA: Diagnosis not present

## 2016-04-18 DIAGNOSIS — M1990 Unspecified osteoarthritis, unspecified site: Secondary | ICD-10-CM | POA: Diagnosis not present

## 2016-04-18 DIAGNOSIS — K219 Gastro-esophageal reflux disease without esophagitis: Secondary | ICD-10-CM | POA: Diagnosis not present

## 2016-04-18 DIAGNOSIS — J449 Chronic obstructive pulmonary disease, unspecified: Secondary | ICD-10-CM | POA: Diagnosis not present

## 2016-04-18 DIAGNOSIS — Z0000 Encounter for general adult medical examination without abnormal findings: Secondary | ICD-10-CM | POA: Diagnosis not present

## 2016-04-18 DIAGNOSIS — E784 Other hyperlipidemia: Secondary | ICD-10-CM | POA: Diagnosis not present

## 2016-04-29 DIAGNOSIS — Z1212 Encounter for screening for malignant neoplasm of rectum: Secondary | ICD-10-CM | POA: Diagnosis not present

## 2016-09-08 DIAGNOSIS — H2513 Age-related nuclear cataract, bilateral: Secondary | ICD-10-CM | POA: Diagnosis not present

## 2016-10-17 DIAGNOSIS — M5380 Other specified dorsopathies, site unspecified: Secondary | ICD-10-CM | POA: Diagnosis not present

## 2016-10-17 DIAGNOSIS — I700 Atherosclerosis of aorta: Secondary | ICD-10-CM | POA: Diagnosis not present

## 2016-10-17 DIAGNOSIS — J449 Chronic obstructive pulmonary disease, unspecified: Secondary | ICD-10-CM | POA: Diagnosis not present

## 2016-10-17 DIAGNOSIS — Z6821 Body mass index (BMI) 21.0-21.9, adult: Secondary | ICD-10-CM | POA: Diagnosis not present

## 2016-10-17 DIAGNOSIS — Z23 Encounter for immunization: Secondary | ICD-10-CM | POA: Diagnosis not present

## 2016-10-17 DIAGNOSIS — D692 Other nonthrombocytopenic purpura: Secondary | ICD-10-CM | POA: Diagnosis not present

## 2016-10-17 DIAGNOSIS — N401 Enlarged prostate with lower urinary tract symptoms: Secondary | ICD-10-CM | POA: Diagnosis not present

## 2016-10-17 DIAGNOSIS — M1990 Unspecified osteoarthritis, unspecified site: Secondary | ICD-10-CM | POA: Diagnosis not present

## 2016-10-17 DIAGNOSIS — K219 Gastro-esophageal reflux disease without esophagitis: Secondary | ICD-10-CM | POA: Diagnosis not present

## 2016-10-17 DIAGNOSIS — E784 Other hyperlipidemia: Secondary | ICD-10-CM | POA: Diagnosis not present

## 2016-10-17 DIAGNOSIS — E1151 Type 2 diabetes mellitus with diabetic peripheral angiopathy without gangrene: Secondary | ICD-10-CM | POA: Diagnosis not present

## 2016-10-17 DIAGNOSIS — R0989 Other specified symptoms and signs involving the circulatory and respiratory systems: Secondary | ICD-10-CM | POA: Diagnosis not present

## 2017-04-17 DIAGNOSIS — E1151 Type 2 diabetes mellitus with diabetic peripheral angiopathy without gangrene: Secondary | ICD-10-CM | POA: Diagnosis not present

## 2017-04-17 DIAGNOSIS — E784 Other hyperlipidemia: Secondary | ICD-10-CM | POA: Diagnosis not present

## 2017-04-17 DIAGNOSIS — Z125 Encounter for screening for malignant neoplasm of prostate: Secondary | ICD-10-CM | POA: Diagnosis not present

## 2017-04-27 DIAGNOSIS — Z23 Encounter for immunization: Secondary | ICD-10-CM | POA: Diagnosis not present

## 2017-04-27 DIAGNOSIS — I700 Atherosclerosis of aorta: Secondary | ICD-10-CM | POA: Diagnosis not present

## 2017-04-27 DIAGNOSIS — N289 Disorder of kidney and ureter, unspecified: Secondary | ICD-10-CM | POA: Diagnosis not present

## 2017-04-27 DIAGNOSIS — Z0000 Encounter for general adult medical examination without abnormal findings: Secondary | ICD-10-CM | POA: Diagnosis not present

## 2017-04-27 DIAGNOSIS — Z1389 Encounter for screening for other disorder: Secondary | ICD-10-CM | POA: Diagnosis not present

## 2017-04-27 DIAGNOSIS — M5136 Other intervertebral disc degeneration, lumbar region: Secondary | ICD-10-CM | POA: Diagnosis not present

## 2017-04-27 DIAGNOSIS — N401 Enlarged prostate with lower urinary tract symptoms: Secondary | ICD-10-CM | POA: Diagnosis not present

## 2017-04-27 DIAGNOSIS — Z720 Tobacco use: Secondary | ICD-10-CM | POA: Diagnosis not present

## 2017-04-27 DIAGNOSIS — D692 Other nonthrombocytopenic purpura: Secondary | ICD-10-CM | POA: Diagnosis not present

## 2017-04-27 DIAGNOSIS — J449 Chronic obstructive pulmonary disease, unspecified: Secondary | ICD-10-CM | POA: Diagnosis not present

## 2017-04-27 DIAGNOSIS — Z6822 Body mass index (BMI) 22.0-22.9, adult: Secondary | ICD-10-CM | POA: Diagnosis not present

## 2017-04-27 DIAGNOSIS — R0989 Other specified symptoms and signs involving the circulatory and respiratory systems: Secondary | ICD-10-CM | POA: Diagnosis not present

## 2017-04-27 DIAGNOSIS — M5380 Other specified dorsopathies, site unspecified: Secondary | ICD-10-CM | POA: Diagnosis not present

## 2017-04-29 DIAGNOSIS — Z1212 Encounter for screening for malignant neoplasm of rectum: Secondary | ICD-10-CM | POA: Diagnosis not present

## 2017-06-02 DIAGNOSIS — H2513 Age-related nuclear cataract, bilateral: Secondary | ICD-10-CM | POA: Diagnosis not present

## 2017-06-02 DIAGNOSIS — H25043 Posterior subcapsular polar age-related cataract, bilateral: Secondary | ICD-10-CM | POA: Diagnosis not present

## 2017-06-02 DIAGNOSIS — H2511 Age-related nuclear cataract, right eye: Secondary | ICD-10-CM | POA: Diagnosis not present

## 2017-06-02 DIAGNOSIS — H18413 Arcus senilis, bilateral: Secondary | ICD-10-CM | POA: Diagnosis not present

## 2017-06-02 DIAGNOSIS — H25013 Cortical age-related cataract, bilateral: Secondary | ICD-10-CM | POA: Diagnosis not present

## 2017-07-13 DIAGNOSIS — S90414A Abrasion, right lesser toe(s), initial encounter: Secondary | ICD-10-CM | POA: Diagnosis not present

## 2017-07-27 DIAGNOSIS — H2511 Age-related nuclear cataract, right eye: Secondary | ICD-10-CM | POA: Diagnosis not present

## 2017-07-27 DIAGNOSIS — H52201 Unspecified astigmatism, right eye: Secondary | ICD-10-CM | POA: Diagnosis not present

## 2017-07-27 DIAGNOSIS — H2513 Age-related nuclear cataract, bilateral: Secondary | ICD-10-CM | POA: Diagnosis not present

## 2017-07-28 DIAGNOSIS — H2512 Age-related nuclear cataract, left eye: Secondary | ICD-10-CM | POA: Diagnosis not present

## 2017-08-24 DIAGNOSIS — H2512 Age-related nuclear cataract, left eye: Secondary | ICD-10-CM | POA: Diagnosis not present

## 2017-08-24 DIAGNOSIS — H2513 Age-related nuclear cataract, bilateral: Secondary | ICD-10-CM | POA: Diagnosis not present

## 2017-08-24 DIAGNOSIS — H52202 Unspecified astigmatism, left eye: Secondary | ICD-10-CM | POA: Diagnosis not present

## 2017-10-26 DIAGNOSIS — M5380 Other specified dorsopathies, site unspecified: Secondary | ICD-10-CM | POA: Diagnosis not present

## 2017-10-26 DIAGNOSIS — J449 Chronic obstructive pulmonary disease, unspecified: Secondary | ICD-10-CM | POA: Diagnosis not present

## 2017-10-26 DIAGNOSIS — R0989 Other specified symptoms and signs involving the circulatory and respiratory systems: Secondary | ICD-10-CM | POA: Diagnosis not present

## 2017-10-26 DIAGNOSIS — D692 Other nonthrombocytopenic purpura: Secondary | ICD-10-CM | POA: Diagnosis not present

## 2017-10-26 DIAGNOSIS — N289 Disorder of kidney and ureter, unspecified: Secondary | ICD-10-CM | POA: Diagnosis not present

## 2017-10-26 DIAGNOSIS — Z720 Tobacco use: Secondary | ICD-10-CM | POA: Diagnosis not present

## 2017-10-26 DIAGNOSIS — I700 Atherosclerosis of aorta: Secondary | ICD-10-CM | POA: Diagnosis not present

## 2017-10-26 DIAGNOSIS — M5136 Other intervertebral disc degeneration, lumbar region: Secondary | ICD-10-CM | POA: Diagnosis not present

## 2017-10-26 DIAGNOSIS — E1151 Type 2 diabetes mellitus with diabetic peripheral angiopathy without gangrene: Secondary | ICD-10-CM | POA: Diagnosis not present

## 2017-10-26 DIAGNOSIS — Z6823 Body mass index (BMI) 23.0-23.9, adult: Secondary | ICD-10-CM | POA: Diagnosis not present

## 2017-10-26 DIAGNOSIS — Z23 Encounter for immunization: Secondary | ICD-10-CM | POA: Diagnosis not present

## 2017-10-26 DIAGNOSIS — K5900 Constipation, unspecified: Secondary | ICD-10-CM | POA: Diagnosis not present

## 2018-01-19 DIAGNOSIS — E1151 Type 2 diabetes mellitus with diabetic peripheral angiopathy without gangrene: Secondary | ICD-10-CM | POA: Diagnosis not present

## 2018-01-19 DIAGNOSIS — Z6824 Body mass index (BMI) 24.0-24.9, adult: Secondary | ICD-10-CM | POA: Diagnosis not present

## 2018-01-19 DIAGNOSIS — J209 Acute bronchitis, unspecified: Secondary | ICD-10-CM | POA: Diagnosis not present

## 2018-01-19 DIAGNOSIS — J449 Chronic obstructive pulmonary disease, unspecified: Secondary | ICD-10-CM | POA: Diagnosis not present

## 2018-01-19 DIAGNOSIS — J329 Chronic sinusitis, unspecified: Secondary | ICD-10-CM | POA: Diagnosis not present

## 2018-02-25 DIAGNOSIS — L821 Other seborrheic keratosis: Secondary | ICD-10-CM | POA: Diagnosis not present

## 2018-02-25 DIAGNOSIS — D229 Melanocytic nevi, unspecified: Secondary | ICD-10-CM | POA: Diagnosis not present

## 2018-04-06 DIAGNOSIS — L720 Epidermal cyst: Secondary | ICD-10-CM | POA: Diagnosis not present

## 2018-05-10 DIAGNOSIS — M5136 Other intervertebral disc degeneration, lumbar region: Secondary | ICD-10-CM | POA: Diagnosis not present

## 2018-05-10 DIAGNOSIS — M47816 Spondylosis without myelopathy or radiculopathy, lumbar region: Secondary | ICD-10-CM | POA: Diagnosis not present

## 2018-06-21 DIAGNOSIS — M5136 Other intervertebral disc degeneration, lumbar region: Secondary | ICD-10-CM | POA: Diagnosis not present

## 2018-06-21 DIAGNOSIS — M47817 Spondylosis without myelopathy or radiculopathy, lumbosacral region: Secondary | ICD-10-CM | POA: Diagnosis not present

## 2018-06-25 DIAGNOSIS — M5136 Other intervertebral disc degeneration, lumbar region: Secondary | ICD-10-CM | POA: Diagnosis not present

## 2018-06-25 DIAGNOSIS — M5416 Radiculopathy, lumbar region: Secondary | ICD-10-CM | POA: Diagnosis not present

## 2018-06-25 DIAGNOSIS — M48062 Spinal stenosis, lumbar region with neurogenic claudication: Secondary | ICD-10-CM | POA: Diagnosis not present

## 2018-06-29 DIAGNOSIS — M5417 Radiculopathy, lumbosacral region: Secondary | ICD-10-CM | POA: Diagnosis not present

## 2018-06-29 DIAGNOSIS — M5416 Radiculopathy, lumbar region: Secondary | ICD-10-CM | POA: Diagnosis not present

## 2018-07-14 DIAGNOSIS — M5416 Radiculopathy, lumbar region: Secondary | ICD-10-CM | POA: Diagnosis not present

## 2018-07-14 DIAGNOSIS — M5136 Other intervertebral disc degeneration, lumbar region: Secondary | ICD-10-CM | POA: Diagnosis not present

## 2018-08-12 DIAGNOSIS — M5416 Radiculopathy, lumbar region: Secondary | ICD-10-CM | POA: Diagnosis not present

## 2018-08-16 DIAGNOSIS — Z0000 Encounter for general adult medical examination without abnormal findings: Secondary | ICD-10-CM | POA: Diagnosis not present

## 2018-08-26 DIAGNOSIS — M5416 Radiculopathy, lumbar region: Secondary | ICD-10-CM | POA: Diagnosis not present

## 2018-08-26 DIAGNOSIS — M545 Low back pain: Secondary | ICD-10-CM | POA: Diagnosis not present

## 2018-09-02 DIAGNOSIS — N138 Other obstructive and reflux uropathy: Secondary | ICD-10-CM | POA: Diagnosis not present

## 2018-09-02 DIAGNOSIS — Z79899 Other long term (current) drug therapy: Secondary | ICD-10-CM | POA: Diagnosis not present

## 2018-09-02 DIAGNOSIS — N401 Enlarged prostate with lower urinary tract symptoms: Secondary | ICD-10-CM | POA: Diagnosis not present

## 2018-09-02 DIAGNOSIS — F17210 Nicotine dependence, cigarettes, uncomplicated: Secondary | ICD-10-CM | POA: Diagnosis not present

## 2018-09-02 DIAGNOSIS — Z125 Encounter for screening for malignant neoplasm of prostate: Secondary | ICD-10-CM | POA: Diagnosis not present

## 2018-09-02 DIAGNOSIS — R5382 Chronic fatigue, unspecified: Secondary | ICD-10-CM | POA: Diagnosis not present

## 2018-09-02 DIAGNOSIS — R972 Elevated prostate specific antigen [PSA]: Secondary | ICD-10-CM | POA: Diagnosis not present

## 2018-09-02 DIAGNOSIS — Z1211 Encounter for screening for malignant neoplasm of colon: Secondary | ICD-10-CM | POA: Diagnosis not present

## 2018-09-02 DIAGNOSIS — Z130 Encounter for screening for diseases of the blood and blood-forming organs and certain disorders involving the immune mechanism: Secondary | ICD-10-CM | POA: Diagnosis not present

## 2018-09-02 DIAGNOSIS — Z13220 Encounter for screening for lipoid disorders: Secondary | ICD-10-CM | POA: Diagnosis not present

## 2018-09-27 DIAGNOSIS — R3912 Poor urinary stream: Secondary | ICD-10-CM | POA: Diagnosis not present

## 2018-09-27 DIAGNOSIS — N529 Male erectile dysfunction, unspecified: Secondary | ICD-10-CM | POA: Diagnosis not present

## 2018-09-27 DIAGNOSIS — R972 Elevated prostate specific antigen [PSA]: Secondary | ICD-10-CM | POA: Diagnosis not present

## 2018-09-27 DIAGNOSIS — N401 Enlarged prostate with lower urinary tract symptoms: Secondary | ICD-10-CM | POA: Diagnosis not present

## 2018-10-15 DIAGNOSIS — Z23 Encounter for immunization: Secondary | ICD-10-CM | POA: Diagnosis not present

## 2018-10-23 DIAGNOSIS — R05 Cough: Secondary | ICD-10-CM | POA: Diagnosis not present

## 2018-10-23 DIAGNOSIS — Z720 Tobacco use: Secondary | ICD-10-CM | POA: Diagnosis not present

## 2018-10-23 DIAGNOSIS — J181 Lobar pneumonia, unspecified organism: Secondary | ICD-10-CM | POA: Diagnosis not present

## 2018-11-02 DIAGNOSIS — N138 Other obstructive and reflux uropathy: Secondary | ICD-10-CM | POA: Diagnosis not present

## 2018-11-02 DIAGNOSIS — N401 Enlarged prostate with lower urinary tract symptoms: Secondary | ICD-10-CM | POA: Diagnosis not present

## 2018-11-02 DIAGNOSIS — J189 Pneumonia, unspecified organism: Secondary | ICD-10-CM | POA: Diagnosis not present
